# Patient Record
Sex: Male | Born: 1995 | Race: Black or African American | Hispanic: No | Marital: Single | Smoking: Current every day smoker
Health system: Southern US, Community
[De-identification: ages and names within clinical notes are randomized; demographics above are authoritative.]

---

## 2016-07-28 ENCOUNTER — Emergency Department (HOSPITAL_COMMUNITY): Payer: BC Managed Care – PPO

## 2016-07-28 ENCOUNTER — Emergency Department (HOSPITAL_COMMUNITY)
Admission: EM | Admit: 2016-07-28 | Discharge: 2016-07-28 | Disposition: A | Discharge status: Home / Self Care | Payer: BC Managed Care – PPO | Attending: Emergency Medicine | Admitting: Emergency Medicine

## 2016-07-28 ENCOUNTER — Encounter (HOSPITAL_COMMUNITY): Payer: Self-pay | Admitting: Emergency Medicine

## 2016-07-28 DIAGNOSIS — Z23 Encounter for immunization: Secondary | ICD-10-CM | POA: Insufficient documentation

## 2016-07-28 DIAGNOSIS — F17210 Nicotine dependence, cigarettes, uncomplicated: Secondary | ICD-10-CM | POA: Insufficient documentation

## 2016-07-28 DIAGNOSIS — Y939 Activity, unspecified: Secondary | ICD-10-CM | POA: Insufficient documentation

## 2016-07-28 DIAGNOSIS — S61412A Laceration without foreign body of left hand, initial encounter: Secondary | ICD-10-CM | POA: Diagnosis not present

## 2016-07-28 DIAGNOSIS — S41112A Laceration without foreign body of left upper arm, initial encounter: Secondary | ICD-10-CM

## 2016-07-28 DIAGNOSIS — Y92481 Parking lot as the place of occurrence of the external cause: Secondary | ICD-10-CM | POA: Diagnosis not present

## 2016-07-28 DIAGNOSIS — S51812A Laceration without foreign body of left forearm, initial encounter: Secondary | ICD-10-CM | POA: Diagnosis not present

## 2016-07-28 DIAGNOSIS — Y999 Unspecified external cause status: Secondary | ICD-10-CM | POA: Diagnosis not present

## 2016-07-28 MED ORDER — HYDROCODONE-ACETAMINOPHEN 5-325 MG PO TABS
1.0000 | ORAL_TABLET | Freq: Once | ORAL | Status: AC
  Administered 2016-07-28: 1 via ORAL
  Filled 2016-07-28: qty 1

## 2016-07-28 MED ORDER — LIDOCAINE-EPINEPHRINE (PF) 2 %-1:200000 IJ SOLN
20.0000 mL | Freq: Once | INTRAMUSCULAR | Status: AC
  Administered 2016-07-28: 20 mL
  Filled 2016-07-28: qty 20

## 2016-07-28 MED ORDER — TETANUS-DIPHTH-ACELL PERTUSSIS 5-2.5-18.5 LF-MCG/0.5 IM SUSP
0.5000 mL | Freq: Once | INTRAMUSCULAR | Status: AC
  Administered 2016-07-28: 0.5 mL via INTRAMUSCULAR

## 2016-07-28 MED ORDER — NAPROXEN 500 MG PO TABS: 500.0000 mg | ORAL_TABLET | Freq: Two times a day (BID) | ORAL | 0 refills | Status: AC

## 2016-07-28 MED ORDER — TETANUS-DIPHTH-ACELL PERTUSSIS 5-2.5-18.5 LF-MCG/0.5 IM SUSP
INTRAMUSCULAR | Status: AC
  Filled 2016-07-28: qty 0.5

## 2016-07-28 NOTE — ED Notes (Signed)
Patient transported to X-ray 

## 2016-07-28 NOTE — Discharge Instructions (Signed)
You need sutures removed in 7-10 days.
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
## 2016-07-28 NOTE — ED Provider Notes (Signed)
LACERATION REPAIR Performed by: PA student Memory DanceBenning under direct supervision of me, Engineer, maintenanceJaime Pilcher Ward Authorized by: Chase PicketJaime Pilcher Ward Consent: Verbal consent obtained. Risks and benefits: risks, benefits and alternatives were discussed Consent given by: patient Patient identity confirmed: provided demographic data Prepped and Draped in normal sterile fashion Wound explored Laceration Location: Left forearm  Laceration Length: 7 cm No Foreign Bodies seen or palpated Anesthesia: local infiltration Local anesthetic: lidocaine 2% with epinephrine Anesthetic total:  4 ml Irrigation method: syringe Amount of cleaning: standard Skin closure: #2 4-0 Vicryl mattress deep, #9 4-0 Ethilon simple interrupted Patient tolerance: Patient tolerated the procedure well with no immediate complications.  LACERATION REPAIR Performed by: Chase PicketJaime Pilcher Ward Authorized by: Chase PicketJaime Pilcher Ward Consent: Verbal consent obtained. Risks and benefits: risks, benefits and alternatives were discussed Consent given by: patient Patient identity confirmed: provided demographic data Prepped and Draped in normal sterile fashion Wound explored Laceration Location: Left hand Laceration Length: 3cm No Foreign Bodies seen or palpated Anesthesia: local infiltration Local anesthetic: lidocaine 2% with epinephrine Anesthetic total: 3 ml Irrigation method: syringe Amount of cleaning: standard Skin closure: 5-0 Prolene Number of sutures: 7 Technique: simple interrupted.  Patient tolerance: Patient tolerated the procedure well with no immediate complications.    [facility] Medical Specialists AssociationJaime Pilcher Ward, PA-C 07/28/16 [id number]    Shon Batonourtney F Horton, MD 07/28/16 [phone]
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
## 2018-08-20 DEATH — deceased
